# Patient Record
Sex: Male | Born: 1995 | Race: White | Hispanic: No | Marital: Single | Smoking: Never smoker
Health system: Southern US, Community
[De-identification: ages and names within clinical notes are randomized; demographics above are authoritative.]

## PROBLEM LIST (undated history)

## (undated) DIAGNOSIS — S42009A Fracture of unspecified part of unspecified clavicle, initial encounter for closed fracture: Secondary | ICD-10-CM

## (undated) DIAGNOSIS — K219 Gastro-esophageal reflux disease without esophagitis: Secondary | ICD-10-CM

---
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
## 2016-03-21 IMAGING — CR DG CLAVICLE*R*
3 series · 3 of 3 positions shown · non-contrast
Comparison: Concurrently obtained radiographs of the right shoulder

CLINICAL DATA: Soccer injury, right shoulder pain

EXAM:
RIGHT CLAVICLE - 2+ VIEWS

[w clavicle ap right *]
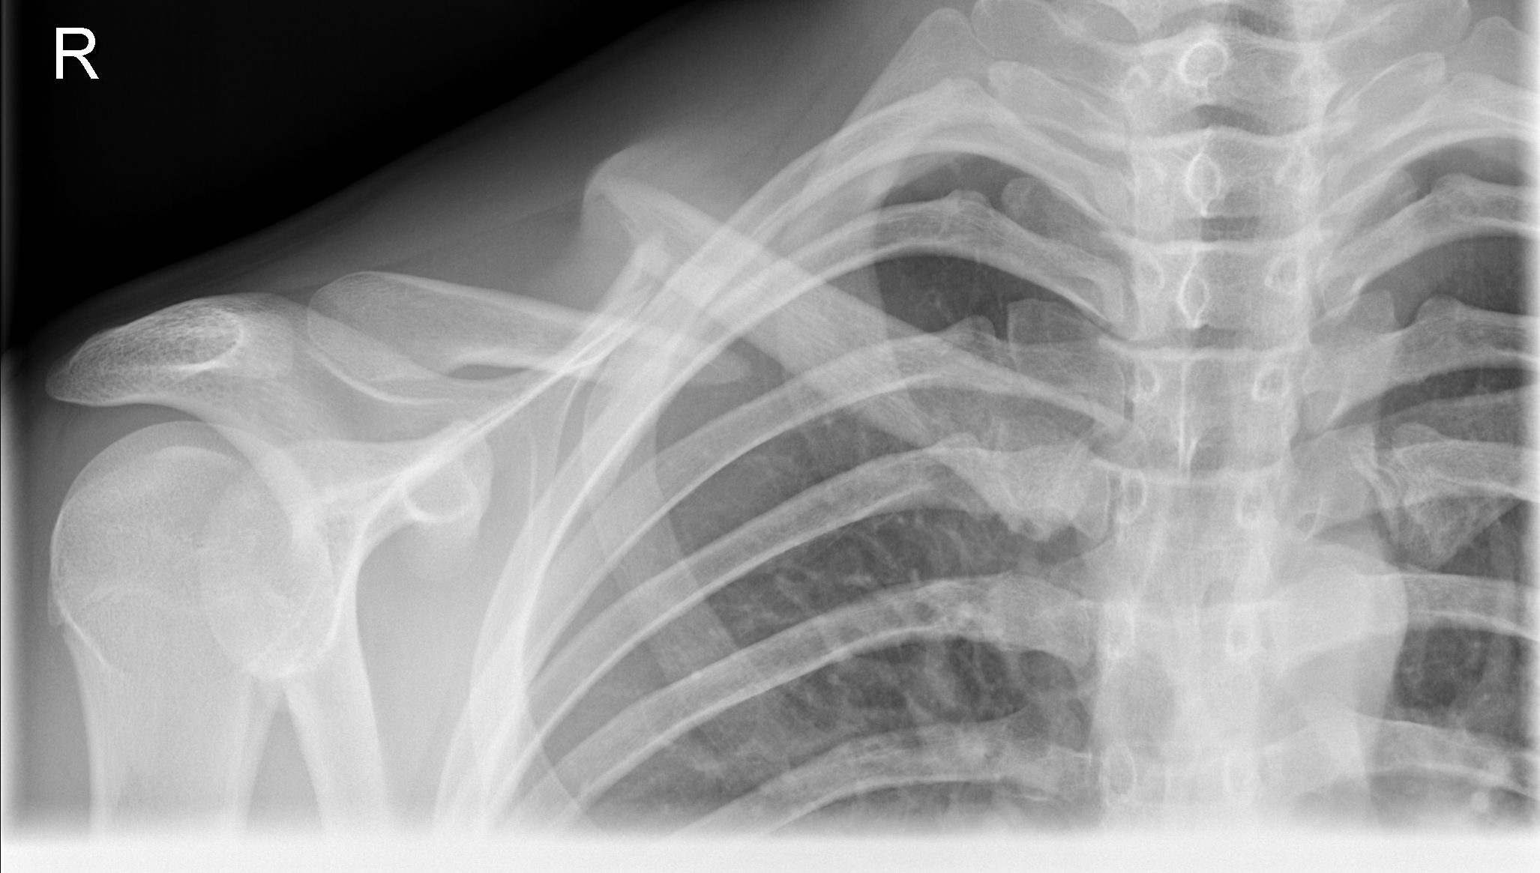

[w clavicle tangential right * (1 of 2)]
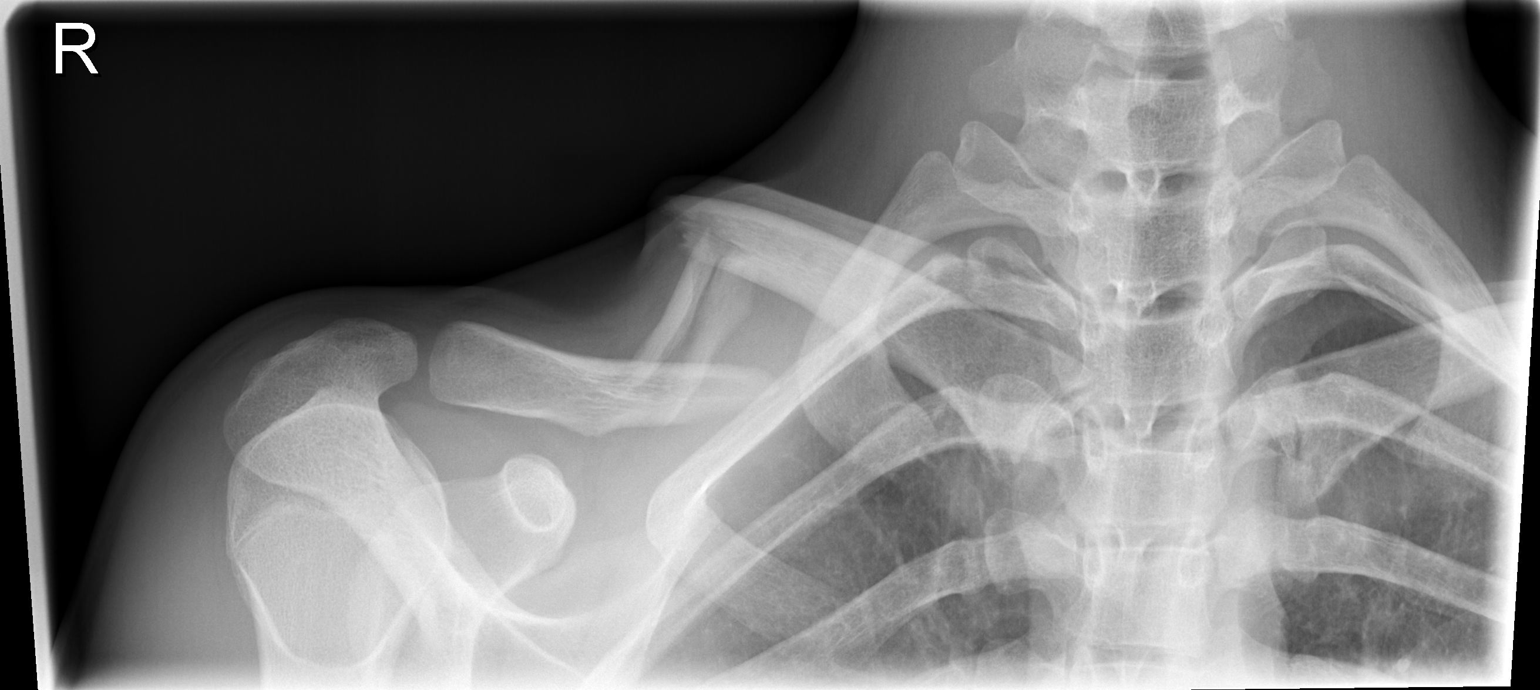

[w clavicle tangential right * (2 of 2)]
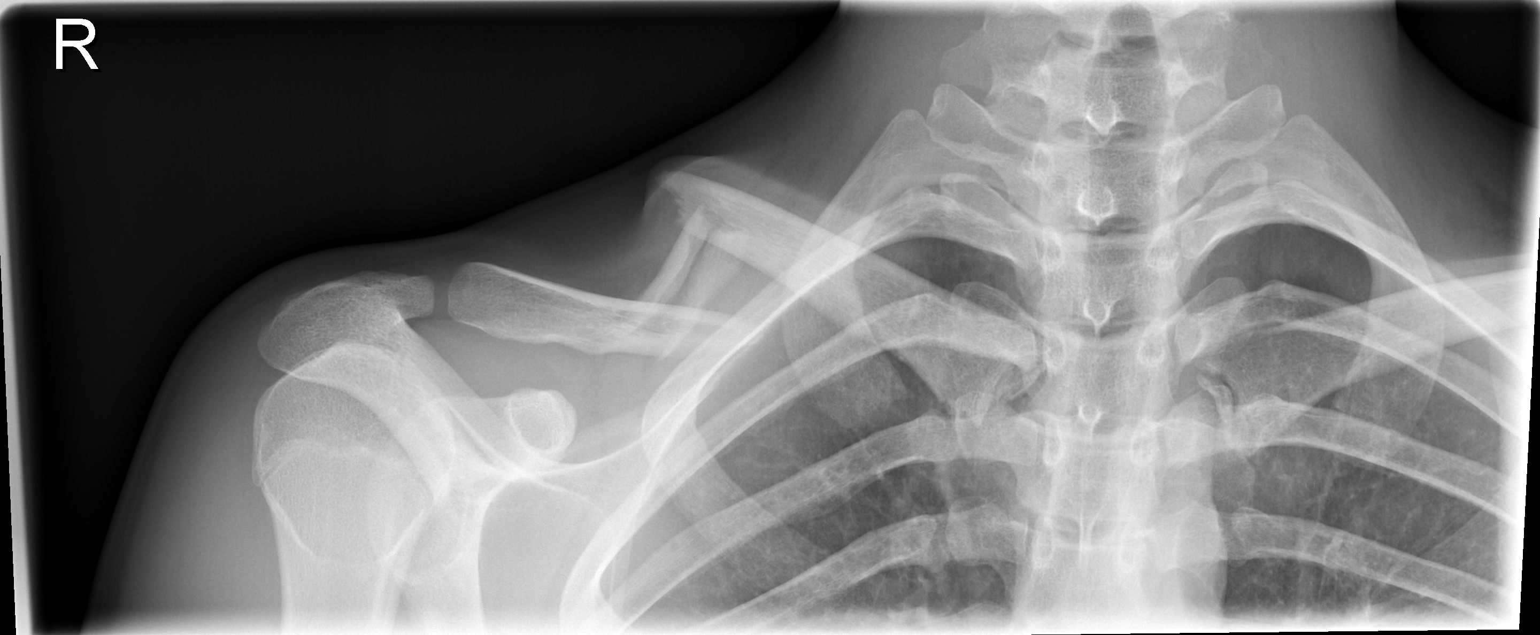

[3 of 3 positions shown; findings below may reference images not displayed]

FINDINGS: Acute displaced and comminuted fracture of the mid clavicle. The
medial fracture fragment is displaced superiorly and appears to be
tenting the skin. There is at least 4 of superior displacement of
the medial fragment. The visualized thorax is unremarkable.
IMPRESSION: Acute, displaced and comminuted fracture of the mid clavicle with 4
cm of superior displacement of the medial fragment resulting in
tenting of the overlying skin.

## 2016-04-17 MED FILL — TRETINOIN 0.05% CREAM: 0.05 | 10 days supply | Qty: 20 | Fill #1

## 2016-04-17 MED FILL — CLINDAMYCIN-BENZOYL PEROX 1: 1-5 | 20 days supply | Qty: 50 | Fill #1

## 2016-06-10 DIAGNOSIS — L700 Acne vulgaris: Secondary | ICD-10-CM | POA: Diagnosis not present

## 2016-06-10 MED FILL — DOXYCYCLINE HYCLATE 100 MG: 100 | 30 days supply | Qty: 60 | Fill #0

## 2016-07-04 MED FILL — DOXYCYCLINE HYCLATE 100 MG: 100 | 90 days supply | Qty: 180 | Fill #1

## 2016-07-08 MED FILL — TRETINOIN 0.05% CREAM: 0.05 | 20 days supply | Qty: 20 | Fill #0

## 2016-07-08 MED FILL — CLINDAMYCIN-BENZOYL PEROX 1: 1-5 | 25 days supply | Qty: 50 | Fill #0

## 2016-10-13 MED FILL — DOXYCYCLINE HYCLATE 100 MG: 100 | 60 days supply | Qty: 120 | Fill #2

## 2016-10-13 MED FILL — CLINDAMYCIN-BENZOYL PEROX 1: 1-5 | 25 days supply | Qty: 50 | Fill #1

## 2016-12-03 MED FILL — DOXYCYCLINE HYCLATE 100 MG: 100 | 30 days supply | Qty: 60 | Fill #0

## 2017-01-06 MED FILL — DOXYCYCLINE HYCLATE 100 MG: 100 | 90 days supply | Qty: 180 | Fill #1

## 2017-04-09 MED FILL — DOXYCYCLINE HYC 100 MG TAB: 100 | 60 days supply | Qty: 120 | Fill #2

## 2017-06-08 MED FILL — DOXYCYCLINE HYC 100 MG TAB: 100 | 30 days supply | Qty: 60 | Fill #0

## 2017-07-09 MED FILL — DOXYCYCLINE HYC 100 MG TAB: 100 | 30 days supply | Qty: 60 | Fill #1

## 2017-08-07 MED FILL — DOXYCYCLINE HYC 100 MG TAB: 100 | 30 days supply | Qty: 60 | Fill #2

## 2017-09-04 DIAGNOSIS — Z79899 Other long term (current) drug therapy: Secondary | ICD-10-CM | POA: Diagnosis not present

## 2017-09-04 DIAGNOSIS — L700 Acne vulgaris: Secondary | ICD-10-CM | POA: Diagnosis not present

## 2017-10-09 DIAGNOSIS — L700 Acne vulgaris: Secondary | ICD-10-CM | POA: Diagnosis not present

## 2017-10-09 DIAGNOSIS — Z79899 Other long term (current) drug therapy: Secondary | ICD-10-CM | POA: Diagnosis not present

## 2017-11-13 DIAGNOSIS — Z79899 Other long term (current) drug therapy: Secondary | ICD-10-CM | POA: Diagnosis not present

## 2017-11-13 DIAGNOSIS — L700 Acne vulgaris: Secondary | ICD-10-CM | POA: Diagnosis not present

## 2017-11-16 MED FILL — MYORISAN 40 MG CAPSULE: 40 | 30 days supply | Qty: 60 | Fill #0

## 2017-12-17 DIAGNOSIS — Z79899 Other long term (current) drug therapy: Secondary | ICD-10-CM | POA: Diagnosis not present

## 2017-12-17 DIAGNOSIS — L700 Acne vulgaris: Secondary | ICD-10-CM | POA: Diagnosis not present

## 2018-01-21 DIAGNOSIS — Z79899 Other long term (current) drug therapy: Secondary | ICD-10-CM | POA: Diagnosis not present

## 2018-01-21 DIAGNOSIS — L700 Acne vulgaris: Secondary | ICD-10-CM | POA: Diagnosis not present

## 2018-02-19 DIAGNOSIS — L700 Acne vulgaris: Secondary | ICD-10-CM | POA: Diagnosis not present

## 2018-02-19 DIAGNOSIS — J342 Deviated nasal septum: Secondary | ICD-10-CM | POA: Diagnosis not present

## 2018-02-19 DIAGNOSIS — J310 Chronic rhinitis: Secondary | ICD-10-CM | POA: Diagnosis not present

## 2018-02-19 DIAGNOSIS — Z79899 Other long term (current) drug therapy: Secondary | ICD-10-CM | POA: Diagnosis not present

## 2018-04-21 ENCOUNTER — Ambulatory Visit: Payer: Self-pay | Admitting: *Deleted

## 2018-04-21 DIAGNOSIS — S0101XA Laceration without foreign body of scalp, initial encounter: Secondary | ICD-10-CM | POA: Diagnosis not present

## 2018-04-21 DIAGNOSIS — Z23 Encounter for immunization: Secondary | ICD-10-CM | POA: Diagnosis not present

## 2018-04-21 NOTE — Telephone Encounter (Signed)
  Reason for Disposition . [1] Last tetanus shot > 5 years ago AND [2] DIRTY cut or scrape  Answer Assessment - Initial Assessment Questions 1. MECHANISM: "How did the injury happen?" For falls, ask: "What height did you fall from?" and "What surface did you fall against?"      Hit head on basketball goal- bolt sticking out 2. ONSET: "When did the injury happen?" (Minutes or hours ago)      20 minutes ago 3. NEUROLOGIC SYMPTOMS: "Was there any loss of consciousness?" "Are there any other neurological symptoms?"      no 4. MENTAL STATUS: "Does the person know who he is, who you are, and where he is?"      yes 5. LOCATION: "What part of the head was hit?"      Very top of head 6. SCALP APPEARANCE: "What does the scalp look like? Is it bleeding now?" If so, ask: "Is it difficult to stop?"      Can't see cut- bleeding stopped very quickly 7. SIZE: For cuts, bruises, or swelling, ask: "How large is it?" (e.g., inches or centimeters)       8. PAIN: "Is there any pain?" If so, ask: "How bad is it?"  (e.g., Scale 1-10; or mild, moderate, severe)     mild 9. TETANUS: For any breaks in the skin, ask: "When was the last tetanus booster?"     2002 10. OTHER SYMPTOMS: "Do you have any other symptoms?" (e.g., neck pain, vomiting)       no 11. PREGNANCY: "Is there any chance you are pregnant?" "When was your last menstrual period?"       no  Protocols used: HEAD INJURY-A-AH

## 2018-04-21 NOTE — Telephone Encounter (Signed)
Patient and mother are calling to report that patient has cut his head on rusty bolt on basketball goal. They do not believe that the cut is deep enough to need stitches- although they have not evaluated the area well- the bleeding stopped quickly. Patient will need a Tdap - the last on record was 2008. Patient has been away at college and he has not been to the office. Called office and they are fully booked. Offered appointment at another office and they decline- mother is going to try to clean the wound better and she is going to [facility] to [facility].

## 2018-04-21 NOTE — Telephone Encounter (Signed)
Noted  

## 2018-04-27 DIAGNOSIS — Z4802 Encounter for removal of sutures: Secondary | ICD-10-CM | POA: Diagnosis not present

## 2022-11-28 ENCOUNTER — Ambulatory Visit (INDEPENDENT_AMBULATORY_CARE_PROVIDER_SITE_OTHER): Payer: 59 | Admitting: Podiatry

## 2022-11-28 ENCOUNTER — Ambulatory Visit (INDEPENDENT_AMBULATORY_CARE_PROVIDER_SITE_OTHER): Payer: 59

## 2022-11-28 ENCOUNTER — Encounter: Payer: Self-pay | Admitting: Podiatry

## 2022-11-28 DIAGNOSIS — M7751 Other enthesopathy of right foot: Secondary | ICD-10-CM

## 2022-11-28 DIAGNOSIS — M109 Gout, unspecified: Secondary | ICD-10-CM | POA: Diagnosis not present

## 2022-11-28 MED ORDER — TRIAMCINOLONE ACETONIDE 10 MG/ML IJ SUSP
10.0000 mg | Freq: Once | INTRAMUSCULAR | Status: AC
  Administered 2022-11-28: 10 mg

## 2022-11-28 NOTE — Progress Notes (Signed)
Subjective:   Patient ID: Guy Watts, male   DOB: 26 y.o.   MRN: [id number]   HPI Patient presents stating that he has had a lot of pain in his big toe joint for 3 days.  States that he did do some heavy eating over the holiday prior to this occurring and it has been very sore in the joint does not remember specific history of this condition.  Patient does not smoke likes to be active   Review of Systems  All other systems reviewed and are negative.       Objective:  Physical Exam Vitals and nursing note reviewed.  Constitutional:      Appearance: He is well-developed.  Pulmonary:     Effort: Pulmonary effort is normal.  Musculoskeletal:        General: Normal range of motion.  Skin:    General: Skin is warm.  Neurological:     Mental Status: He is alert.     Neurovascular status intact muscle strength adequate range of motion within normal limits with patient found to have reduced range of motion first MPJ right moderate swelling around the joint good digital perfusion well-oriented x 3     Assessment:  Inflammatory capsulitis first MPJ right which may be secondary to trauma or some form of subtle process or possibly could be inflammatory or gout     Plan:  H&P x-rays reviewed condition discussed.  At this point I did do sterile prep I did periarticular injection around the first MPJ 3 mg dexamethasone Kenalog 5 mg Xylocaine and I am sending for blood work to rule out gout or other inflammatory conditions.  I also advised on rigid bottom shoes and oral anti-inflammatories and will be seen back if symptoms persist or issues were to occur  X-rays indicate no signs of arthritis around the joint no indications of functional hallux limitus deformity

## 2022-11-29 LAB — COMPREHENSIVE METABOLIC PANEL
AG Ratio: 1.4 (calc) (ref 1.0–2.5)
ALT: 22 U/L (ref 9–46)
AST: 22 U/L (ref 10–40)
Albumin: 4.4 g/dL (ref 3.6–5.1)
Alkaline phosphatase (APISO): 72 U/L (ref 36–130)
BUN: 13 mg/dL (ref 7–25)
CO2: 29 mmol/L (ref 20–32)
Calcium: 9.8 mg/dL (ref 8.6–10.3)
Chloride: 102 mmol/L (ref 98–110)
Creat: 1.05 mg/dL (ref 0.60–1.24)
Globulin: 3.1 g/dL (calc) (ref 1.9–3.7)
Glucose, Bld: 106 mg/dL — ABNORMAL HIGH (ref 65–99)
Potassium: 4.7 mmol/L (ref 3.5–5.3)
Sodium: 141 mmol/L (ref 135–146)
Total Bilirubin: 0.5 mg/dL (ref 0.2–1.2)
Total Protein: 7.5 g/dL (ref 6.1–8.1)

## 2022-11-29 LAB — CBC WITH DIFFERENTIAL/PLATELET
Absolute Monocytes: 449 cells/uL (ref 200–950)
Basophils Absolute: 53 cells/uL (ref 0–200)
Basophils Relative: 0.8 %
Eosinophils Absolute: 198 cells/uL (ref 15–500)
Eosinophils Relative: 3 %
HCT: 45.9 % (ref 38.5–50.0)
Hemoglobin: 15.7 g/dL (ref 13.2–17.1)
Lymphs Abs: 1868 cells/uL (ref 850–3900)
MCH: 30.7 pg (ref 27.0–33.0)
MCHC: 34.2 g/dL (ref 32.0–36.0)
MCV: 89.8 fL (ref 80.0–100.0)
MPV: 10.5 fL (ref 7.5–12.5)
Monocytes Relative: 6.8 %
Neutro Abs: 4033 cells/uL (ref 1500–7800)
Neutrophils Relative %: 61.1 %
Platelets: 246 10*3/uL (ref 140–400)
RBC: 5.11 10*6/uL (ref 4.20–5.80)
RDW: 12.5 % (ref 11.0–15.0)
Total Lymphocyte: 28.3 %
WBC: 6.6 10*3/uL (ref 3.8–10.8)

## 2022-11-29 LAB — ANA, IFA COMPREHENSIVE PANEL
Anti Nuclear Antibody (ANA): NEGATIVE
ENA SM Ab Ser-aCnc: <1 AI
SM/RNP: <1 AI
SSA (Ro) (ENA) Antibody, IgG: <1 AI
SSB (La) (ENA) Antibody, IgG: <1 AI
Scleroderma (Scl-70) (ENA) Antibody, IgG: <1 AI
ds DNA Ab: <1 IU/mL

## 2022-11-29 LAB — C-REACTIVE PROTEIN: CRP: 18.8 mg/L — ABNORMAL HIGH (ref <8.0)

## 2022-11-29 LAB — URIC ACID: Uric Acid, Serum: 5.7 mg/dL (ref 4.0–8.0)

## 2022-11-29 LAB — RHEUMATOID FACTOR: Rhuematoid fact SerPl-aCnc: <14 IU/mL (ref <14)
